# Patient Record
Sex: Female | Born: 1985 | Hispanic: No | Marital: Single | Smoking: Never smoker
Health system: Southern US, Community
[De-identification: ages and names within clinical notes are randomized; demographics above are authoritative.]

## PROBLEM LIST (undated history)

## (undated) DIAGNOSIS — G8929 Other chronic pain: Secondary | ICD-10-CM

## (undated) DIAGNOSIS — F419 Anxiety disorder, unspecified: Secondary | ICD-10-CM

## (undated) DIAGNOSIS — F32A Depression, unspecified: Secondary | ICD-10-CM

## (undated) DIAGNOSIS — F329 Major depressive disorder, single episode, unspecified: Secondary | ICD-10-CM

## (undated) DIAGNOSIS — R51 Headache: Secondary | ICD-10-CM

## (undated) DIAGNOSIS — J45909 Unspecified asthma, uncomplicated: Secondary | ICD-10-CM

## (undated) HISTORY — DX: Major depressive disorder, single episode, unspecified: F32.9

## (undated) HISTORY — DX: Other chronic pain: G89.29

## (undated) HISTORY — DX: Depression, unspecified: F32.A

## (undated) HISTORY — DX: Unspecified asthma, uncomplicated: J45.909

## (undated) HISTORY — DX: Headache: R51

## (undated) HISTORY — DX: Anxiety disorder, unspecified: F41.9

---

## 2006-05-18 ENCOUNTER — Other Ambulatory Visit: Admission: RE | Admit: 2006-05-18 | Discharge: 2006-05-18 | Payer: Self-pay | Admitting: Family Medicine

## 2012-06-01 ENCOUNTER — Encounter: Payer: Self-pay | Admitting: Internal Medicine

## 2012-06-28 ENCOUNTER — Ambulatory Visit: Payer: Self-pay | Admitting: Internal Medicine

## 2012-07-25 ENCOUNTER — Encounter: Payer: Self-pay | Admitting: Internal Medicine

## 2012-08-15 ENCOUNTER — Ambulatory Visit: Payer: Self-pay | Admitting: Internal Medicine

## 2012-08-21 ENCOUNTER — Other Ambulatory Visit (INDEPENDENT_AMBULATORY_CARE_PROVIDER_SITE_OTHER): Payer: 59

## 2012-08-21 ENCOUNTER — Encounter: Payer: Self-pay | Admitting: Internal Medicine

## 2012-08-21 ENCOUNTER — Ambulatory Visit (INDEPENDENT_AMBULATORY_CARE_PROVIDER_SITE_OTHER): Payer: 59 | Admitting: Internal Medicine

## 2012-08-21 VITALS — BP 120/70 | HR 60 | Ht 66.5 in | Wt 256.6 lb

## 2012-08-21 DIAGNOSIS — K625 Hemorrhage of anus and rectum: Secondary | ICD-10-CM

## 2012-08-21 DIAGNOSIS — R1032 Left lower quadrant pain: Secondary | ICD-10-CM

## 2012-08-21 DIAGNOSIS — K5900 Constipation, unspecified: Secondary | ICD-10-CM

## 2012-08-21 LAB — CBC WITH DIFFERENTIAL/PLATELET
Basophils Relative: 1.2 % (ref 0.0–3.0)
Eosinophils Absolute: 0.1 10*3/uL (ref 0.0–0.7)
Eosinophils Relative: 1.7 % (ref 0.0–5.0)
HCT: 37.8 % (ref 36.0–46.0)
Hemoglobin: 12.6 g/dL (ref 12.0–15.0)
Lymphs Abs: 1.7 10*3/uL (ref 0.7–4.0)
MCHC: 33.2 g/dL (ref 30.0–36.0)
MCV: 82.3 fl (ref 78.0–100.0)
Monocytes Absolute: 0.4 10*3/uL (ref 0.1–1.0)
Neutro Abs: 3.6 10*3/uL (ref 1.4–7.7)
Neutrophils Relative %: 61 % (ref 43.0–77.0)
RBC: 4.6 Mil/uL (ref 3.87–5.11)
WBC: 5.9 10*3/uL (ref 4.5–10.5)

## 2012-08-21 LAB — COMPREHENSIVE METABOLIC PANEL
Alkaline Phosphatase: 51 U/L (ref 39–117)
BUN: 8 mg/dL (ref 6–23)
CO2: 23 mEq/L (ref 19–32)
GFR: 95.64 mL/min (ref >60.00)
Glucose, Bld: 104 mg/dL — ABNORMAL HIGH (ref 70–99)
Sodium: 137 mEq/L (ref 135–145)
Total Bilirubin: 0.7 mg/dL (ref 0.3–1.2)
Total Protein: 7.7 g/dL (ref 6.0–8.3)

## 2012-08-21 MED ORDER — LINACLOTIDE 290 MCG PO CAPS: 1.0000 | ORAL_CAPSULE | Freq: Every day | ORAL | Status: DC

## 2012-08-21 NOTE — Progress Notes (Signed)
  Subjective:    Patient ID: Stefanie Castro, female    DOB: [birth year], 27 y.o.   MRN: [id number]  HPI This very nice young woman presents with complaints of chronic constipation, LLQ pain and rectal bleeding. She reports years of constipation with not more than 3 bowel movements a week. Had seen MD at age 15 and told that's the way she is. Over past 2 years has had LLQ discomfort and more variable stool sizes, including "powdery" and very small. Has also had at least a few episodes of bright red blood in the toilet bowel with/after defecation. Seems worse after she finishes menses. Was seen at [facility] in Dec and KUB was full of stool "I was told I was backed up". She was treated with whole bottle of MiraLax but did not defecate for 2 weeks. Als treated with HC suppositories. Bleeding better.  Diet is "mainly fatty foods, not much vegetables, eats wheat bread but not much fiber'Birth weight not on file going up some. + bloating, flatus and belching issues.  No Known Allergies Hydrocortisone suppository 25 mg prn BCP    Past Medical History  Diagnosis Date  . Anxiety and depression   . Chronic headaches   Childhood asthma History reviewed. No pertinent past surgical history. History   Social History  . Marital Status: Single, no kids                 Occupational History  . Banker [employer]   Social History Main Topics  . Smoking status: Never Smoker   . Smokeless tobacco: Never Used  . Alcohol Use: Yes  . Drug Use: No  .       Family History  Problem Relation Age of Onset  . Diabetes Maternal Grandfather   . Diabetes Maternal Grandmother   . Heart disease Maternal Grandfather     Review of Systems + back pain, allergies, nasal discharge x 1 week, cough x 1 week, menstrual cramps All other ROS negative or as per HPI    Objective:   Physical Exam General:  Well-developed, well-nourished and in no acute distress - obese Eyes:  anicteric. ENT:   Mouth  and posterior pharynx free of lesions.  Neck:   supple w/o thyromegaly or mass.  Lungs: Clear to auscultation bilaterally. Heart:  S1S2, no rubs, murmurs, gallops. Abdomen:  soft, non-tender, no hepatosplenomegaly, hernia, or mass and BS+.  Rectal: Female staff present - small anal tag, + anal wink, normal resting tone, no stool, no rectocele, normal squeeze, she contracted anal sphincter with simulated defecation , decreased descent,  Lymph:  no cervical or supraclavicular adenopathy. Extremities:   no edema Skin   no rash. Neuro:  A&O x 3.  Psych:  appropriate mood and  Affect.      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 I have recommended at least a sigmoidoscopy to better understand this - she wants to think about it. She understands there is a very rare but possible chance of colon or rectal cancer going undiscovered. Info provided and the risks and benefits as well as alternatives of endoscopic procedure(s) have been discussed and reviewed. All questions answered. The patient agrees to proceed. 2. Linzess 290 ug daily trial 3. High fiber diet 4. CBC, CMET, TSH 5. REV 1 month

## 2012-08-21 NOTE — Patient Instructions (Addendum)
Your physician has requested that you go to the basement for the following lab work before leaving today: CBC/diff, TSH, CMET  Today you have been given a High Fiber Diet handout to read and follow.  Also we are giving you colonoscopy and sigmoidscopy information to read over and think about.  Today we are giving you samples of Linzess to take one capsule 30 minutes prior to breakfast daily to help with constipation.  We are giving you a written rx to use if the samples work.  Follow up with [place] in a month.  Thank you for choosing me and [facility].  Iva Boop, M.D., [facility]

## 2012-08-24 NOTE — Progress Notes (Signed)
Quick Note:  Labs are all ok  See you at follow-up ______

## 2012-08-25 ENCOUNTER — Ambulatory Visit: Payer: Self-pay | Admitting: Internal Medicine

## 2016-08-26 ENCOUNTER — Telehealth: Payer: Self-pay | Admitting: General Practice

## 2016-11-23 NOTE — Telephone Encounter (Signed)
-----   Message from Sheliah HatchKatherine E Tabori, MD sent at 08/26/2016 10:38 AM EST ----- Regarding: RE: New Patient Contact: [phone] Ok to establish  Thanks! KT ----- Message ----- From: Drusilla KannerSuandrea L Miles Sent: 08/26/2016  10:29 AM To: Sheliah HatchKatherine E Tabori, MD Subject: New Patient                                    Patient calling to establish care as a new patient. Dr. Beverely Lowabori was recommended to her by her sister Saundra ShellingJanice Henderson, who is an existing patient.  Thanks, Tim LairSuandrea

## 2017-04-18 ENCOUNTER — Emergency Department (HOSPITAL_BASED_OUTPATIENT_CLINIC_OR_DEPARTMENT_OTHER)
Admission: EM | Admit: 2017-04-18 | Discharge: 2017-04-18 | Disposition: A | Discharge status: Home / Self Care | Payer: BLUE CROSS/BLUE SHIELD | Attending: Emergency Medicine | Admitting: Emergency Medicine

## 2017-04-18 ENCOUNTER — Emergency Department (HOSPITAL_BASED_OUTPATIENT_CLINIC_OR_DEPARTMENT_OTHER): Payer: BLUE CROSS/BLUE SHIELD

## 2017-04-18 ENCOUNTER — Encounter (HOSPITAL_BASED_OUTPATIENT_CLINIC_OR_DEPARTMENT_OTHER): Payer: Self-pay

## 2017-04-18 DIAGNOSIS — Z791 Long term (current) use of non-steroidal anti-inflammatories (NSAID): Secondary | ICD-10-CM | POA: Insufficient documentation

## 2017-04-18 DIAGNOSIS — R072 Precordial pain: Secondary | ICD-10-CM | POA: Insufficient documentation

## 2017-04-18 DIAGNOSIS — Z7984 Long term (current) use of oral hypoglycemic drugs: Secondary | ICD-10-CM | POA: Insufficient documentation

## 2017-04-18 DIAGNOSIS — R519 Headache, unspecified: Secondary | ICD-10-CM

## 2017-04-18 DIAGNOSIS — R51 Headache: Secondary | ICD-10-CM | POA: Diagnosis not present

## 2017-04-18 LAB — CBC WITH DIFFERENTIAL/PLATELET
BASOS PCT: 0 %
Basophils Absolute: 0 10*3/uL (ref 0.0–0.1)
EOS ABS: 0.1 10*3/uL (ref 0.0–0.7)
Eosinophils Relative: 1 %
HCT: 34.3 % — ABNORMAL LOW (ref 36.0–46.0)
Hemoglobin: 11.1 g/dL — ABNORMAL LOW (ref 12.0–15.0)
LYMPHS ABS: 1.9 10*3/uL (ref 0.7–4.0)
Lymphocytes Relative: 33 %
MCH: 23.5 pg — AB (ref 26.0–34.0)
MCHC: 32.4 g/dL (ref 30.0–36.0)
MCV: 72.7 fL — ABNORMAL LOW (ref 78.0–100.0)
MONO ABS: 0.6 10*3/uL (ref 0.1–1.0)
Monocytes Relative: 10 %
Neutro Abs: 3.3 10*3/uL (ref 1.7–7.7)
Neutrophils Relative %: 56 %
PLATELETS: 424 10*3/uL — AB (ref 150–400)
RBC: 4.72 MIL/uL (ref 3.87–5.11)
RDW: 14.9 % (ref 11.5–15.5)
WBC: 5.9 10*3/uL (ref 4.0–10.5)

## 2017-04-18 LAB — PREGNANCY, URINE: PREG TEST UR: NEGATIVE

## 2017-04-18 LAB — BASIC METABOLIC PANEL
ANION GAP: 8 (ref 5–15)
BUN: 9 mg/dL (ref 6–20)
CALCIUM: 9 mg/dL (ref 8.9–10.3)
CHLORIDE: 105 mmol/L (ref 101–111)
CO2: 24 mmol/L (ref 22–32)
Creatinine, Ser: 0.72 mg/dL (ref 0.44–1.00)
GFR calc Af Amer: >60 mL/min (ref >60)
GFR calc non Af Amer: >60 mL/min (ref >60)
GLUCOSE: 108 mg/dL — AB (ref 65–99)
Potassium: 3.9 mmol/L (ref 3.5–5.1)
Sodium: 137 mmol/L (ref 135–145)

## 2017-04-18 LAB — D-DIMER, QUANTITATIVE: D-Dimer, Quant: 0.89 ug/mL-FEU — ABNORMAL HIGH (ref 0.00–0.50)

## 2017-04-18 LAB — TROPONIN I: Troponin I: <0.03 ng/mL (ref <0.03)

## 2017-04-18 MED ORDER — DEXAMETHASONE SODIUM PHOSPHATE 10 MG/ML IJ SOLN
10.0000 mg | Freq: Once | INTRAMUSCULAR | Status: AC
  Administered 2017-04-18: 10 mg via INTRAVENOUS
  Filled 2017-04-18: qty 1

## 2017-04-18 MED ORDER — IOPAMIDOL (ISOVUE-370) INJECTION 76%
100.0000 mL | Freq: Once | INTRAVENOUS | Status: AC | PRN
  Administered 2017-04-18: 100 mL via INTRAVENOUS

## 2017-04-18 MED ORDER — SODIUM CHLORIDE 0.9 % IV BOLUS (SEPSIS)
1000.0000 mL | Freq: Once | INTRAVENOUS | Status: AC
  Administered 2017-04-18: 1000 mL via INTRAVENOUS

## 2017-04-18 MED ORDER — SODIUM CHLORIDE 0.9 % IV SOLN
INTRAVENOUS | Status: DC
  Administered 2017-04-18: 16:00:00 via INTRAVENOUS

## 2017-04-18 MED ORDER — METOCLOPRAMIDE HCL 5 MG/ML IJ SOLN
10.0000 mg | Freq: Once | INTRAMUSCULAR | Status: AC
  Administered 2017-04-18: 10 mg via INTRAVENOUS
  Filled 2017-04-18: qty 2

## 2017-04-18 MED ORDER — PROMETHAZINE HCL 25 MG PO TABS: 25.0000 mg | ORAL_TABLET | Freq: Four times a day (QID) | ORAL | 1 refills | Status: AC | PRN

## 2017-04-18 MED ORDER — DIPHENHYDRAMINE HCL 50 MG/ML IJ SOLN
25.0000 mg | Freq: Once | INTRAMUSCULAR | Status: AC
  Administered 2017-04-18: 25 mg via INTRAVENOUS
  Filled 2017-04-18: qty 1

## 2017-04-18 NOTE — Discharge Instructions (Signed)
Recommend follow-up with your regular doctor for thyroid check.  Also if headache persist MRI of brain would be appropriate.  Today's head CT and CT angios chest and workup for the chest pain without any acute findings.  Did give a migraine cocktail here hopefully it will kick in and help relieve the headache but if it does not improve follow-up with your doctor would be important.  Also we noted that your heart rate tends to be in the low 100s.  This is why there is a recommendation for thyroid function test.

## 2017-04-18 NOTE — ED Triage Notes (Signed)
C/o HA 1-2 weeks-was seen at [facility] 10 days ago dx with sinus infection-rx abx and naproxen-states HA no better-c/o CP x 5 days-NAD-steady gait

## 2017-04-18 NOTE — ED Provider Notes (Signed)
[facility] EMERGENCY DEPARTMENT Provider Note   CSN: [id number] Arrival date & time: 04/18/17  1436     History   Chief Complaint Chief Complaint  Patient presents with  . Headache  . Chest Pain    HPI Stefanie Castro is a 31 y.o. female.  Patient presenting with 2 complaints.  One is a headache bilateral temporal area into the back of the head associated with some visual changes.  Headaches been present for 3 weeks.  Patient at one time in the past was evaluated for possible migraines but no confirmed diagnosis.  Other complaint is chest pain that started on Thursday 4 days ago.  Left upper chest.  Not associated with shortness of breath not associated with nausea and vomiting patient's heart rate in triage was noted to be about 110.  Patient is never had chest pain like this before.  Nothing makes it worse or better.  Still present.      Past Medical History:  Diagnosis Date  . Anxiety and depression   . Childhood asthma   . Chronic headaches     There are no active problems to display for this patient.   History reviewed. No pertinent surgical history.  OB History    No data available       Home Medications    Prior to Admission medications   Medication Sig Start Date End Date Taking? Authorizing Provider  METFORMIN HCL PO Take by mouth.   Yes [provider]  NAPROXEN PO Take by mouth.   Yes [provider]  promethazine (PHENERGAN) 25 MG tablet Take 1 tablet (25 mg total) by mouth every 6 (six) hours as needed for nausea or vomiting. 04/18/17   Vanetta Mulders, MD    Family History Family History  Problem Relation Age of Onset  . Diabetes Maternal Grandfather   . Heart disease Maternal Grandfather   . Diabetes Maternal Grandmother     Social History Social History  Substance Use Topics  . Smoking status: Never Smoker  . Smokeless tobacco: Never Used  . Alcohol use Yes     Comment: occ     Allergies   Patient  has no known allergies.   Review of Systems Review of Systems  Constitutional: Negative for fever.  HENT: Negative for congestion.   Eyes: Positive for visual disturbance.  Respiratory: Positive for shortness of breath.   Gastrointestinal: Negative for abdominal pain, nausea and vomiting.  Genitourinary: Negative for dysuria.  Musculoskeletal: Negative for back pain.  Skin: Negative for rash.  Neurological: Positive for headaches. Negative for speech difficulty, weakness and numbness.  Hematological: Does not bruise/bleed easily.  Psychiatric/Behavioral: Negative for confusion.     Physical Exam Updated Vital Signs BP 131/86 (BP Location: Left Arm)   Pulse 90   Temp 98.3 F (36.8 C) (Oral)   Resp (!) 22   Ht 1.676 m (5\' 6" )   Wt 110.6 kg (243 lb 13.3 oz)   LMP 04/09/2017   SpO2 100%   BMI 39.35 kg/m   Physical Exam  Constitutional: She is oriented to person, place, and time. She appears well-developed and well-nourished. No distress.  HENT:  Head: Normocephalic and atraumatic.  Mouth/Throat: Oropharynx is clear and moist.  Eyes: Pupils are equal, round, and reactive to light. Conjunctivae and EOM are normal.  Neck: Normal range of motion. Neck supple.  Cardiovascular: Normal rate, regular rhythm and normal heart sounds.   Pulmonary/Chest: Effort normal and breath sounds normal. No respiratory distress.  Abdominal: Soft. Bowel sounds are normal. There is no tenderness.  Musculoskeletal: Normal range of motion. She exhibits no edema.  Neurological: She is alert and oriented to person, place, and time. No cranial nerve deficit or sensory deficit. She exhibits normal muscle tone. Coordination normal.  Skin: Skin is warm. No rash noted.  Nursing note and vitals reviewed.    ED Treatments / Results  Labs (all labs ordered are listed, but only abnormal results are displayed) Labs Reviewed  CBC WITH DIFFERENTIAL/PLATELET - Abnormal; Notable for the following:        Result Value   Hemoglobin 11.1 (*)    HCT 34.3 (*)    MCV 72.7 (*)    MCH 23.5 (*)    Platelets 424 (*)    All other components within normal limits  BASIC METABOLIC PANEL - Abnormal; Notable for the following:    Glucose, Bld 108 (*)    All other components within normal limits  D-DIMER, QUANTITATIVE (NOT AT [facility]) - Abnormal; Notable for the following:    D-Dimer, Quant 0.89 (*)    All other components within normal limits  TROPONIN I  PREGNANCY, URINE    EKG  EKG Interpretation  Date/Time:  Monday April 18 2017 14:53:38 EDT Ventricular Rate:  112 PR Interval:    QRS Duration: 78 QT Interval:  321 QTC Calculation: 439 R Axis:   23 Text Interpretation:  Sinus tachycardia Probable anteroseptal infarct, old no previous Confirmed by Vanetta Mulders [phone]) on 04/18/2017 3:22:57 PM       Radiology Dg Chest 2 View  Result Date: 04/18/2017 CLINICAL DATA:  Two week history of occipital headache. Five day history of chest pain. Intermittent dizziness when supine. Patient was diagnosed with sinusitis 10 days ago at an urgent care. EXAM: CHEST  2 VIEW COMPARISON:  None. FINDINGS: Cardiomediastinal silhouette unremarkable. Lungs clear. Bronchovascular markings normal. Pulmonary vascularity normal. No pneumothorax. No pleural effusions. Visualized bony thorax intact. IMPRESSION: Normal examination. Electronically Signed   By: Hulan Saas M.D.   On: 04/18/2017 15:56   Ct Head Wo Contrast  Result Date: 04/18/2017 CLINICAL DATA:  31 y/o  F; 1-2 weeks occipital region headache. EXAM: CT HEAD WITHOUT CONTRAST TECHNIQUE: Contiguous axial images were obtained from the base of the skull through the vertex without intravenous contrast. COMPARISON:  None. FINDINGS: Brain: No evidence of acute infarction, hemorrhage, hydrocephalus, extra-axial collection or mass lesion/mass effect. Incidental partially empty sella turcica. Vascular: No hyperdense vessel or unexpected calcification. Skull:  Normal. Negative for fracture or focal lesion. Sinuses/Orbits: No acute finding. Other: None. IMPRESSION: No acute intracranial abnormality identified. Unremarkable CT of head. Electronically Signed   By: Mitzi Hansen M.D.   On: 04/18/2017 16:00   Ct Angio Chest Pe W/cm &/or Wo Cm  Result Date: 04/18/2017 CLINICAL DATA:  Chest pain x5 days.  History of asthma. EXAM: CT ANGIOGRAPHY CHEST WITH CONTRAST TECHNIQUE: Multidetector CT imaging of the chest was performed using the standard protocol during bolus administration of intravenous contrast. Multiplanar CT image reconstructions and MIPs were obtained to evaluate the vascular anatomy. CONTRAST:  100 cc Isovue 370 IV COMPARISON:  04/18/2017 CXR FINDINGS: Cardiovascular: The study is of quality for the evaluation of pulmonary embolism. There are no filling defects in the central, lobar, segmental or subsegmental pulmonary artery branches to suggest acute pulmonary embolism. Great vessels are normal in course and caliber. Normal heart size. No significant pericardial fluid/thickening. Mediastinum/Nodes: No discrete thyroid nodules. Unremarkable esophagus. No pathologically enlarged axillary, mediastinal or  hilar lymph nodes. No aortic aneurysm or dissection. Lungs/Pleura: No pneumothorax. No pleural effusion. No pneumonic consolidation. Upper abdomen: Unremarkable. Musculoskeletal:  No aggressive appearing focal osseous lesions. Review of the MIP images confirms the above findings. IMPRESSION: No acute pulmonary embolus or active pulmonary disease. Electronically Signed   By: Tollie Ethavid  Kwon M.D.   On: 04/18/2017 18:09    Procedures Procedures (including critical care time)  Medications Ordered in ED Medications  0.9 %  sodium chloride infusion ( Intravenous New Bag/Given 04/18/17 1613)  sodium chloride 0.9 % bolus 1,000 mL (0 mLs Intravenous Stopped 04/18/17 1922)  metoCLOPramide (REGLAN) injection 10 mg (10 mg Intravenous Given 04/18/17 1612)    dexamethasone (DECADRON) injection 10 mg (10 mg Intravenous Given 04/18/17 1612)  diphenhydrAMINE (BENADRYL) injection 25 mg (25 mg Intravenous Given 04/18/17 1612)  iopamidol (ISOVUE-370) 76 % injection 100 mL (100 mLs Intravenous Contrast Given 04/18/17 1745)     Initial Impression / Assessment and Plan / ED Course  I have reviewed the triage vital signs and the nursing notes.  Pertinent labs & imaging results that were available during my care of the patient were reviewed by me and considered in my medical decision making (see chart for details).    Patient's workup for the headache was a migraine cocktail that included Reglan and Benadryl and Decadron.  As well as IV fluids.  Also head CT was done which was negative.  Patient without any real significant improvement in headache.  But never appear to be any real distress.  Workup for the chest pain included troponin which was negative chest x-ray without acute findings.  D-dimer was done because of the persistent tachycardia.  D-dimer was slightly elevated.  So patient had CT angios which was negative for pulmonary embolus or any other acute findings.  Labs without significant abnormalities.  Patient will be treated symptomatically.  Phenergan provided take for the headache.  Patient does have a primary care doctor to follow-up with.  If headaches persist will need MRI.   Final Clinical Impressions(s) / ED Diagnoses   Final diagnoses:  Acute intractable headache, unspecified headache type  Precordial pain    New Prescriptions New Prescriptions   PROMETHAZINE (PHENERGAN) 25 MG TABLET    Take 1 tablet (25 mg total) by mouth every 6 (six) hours as needed for nausea or vomiting.     Vanetta MuldersZackowski, Naelle Diegel, MD 04/19/17 [phone]

## 2018-03-25 IMAGING — CT CT ANGIO CHEST
2 of 8 series · 19 of 36 positions shown · IV contrast (isovue)
Comparison: 04/18/2017 CXR

CLINICAL DATA: Chest pain x5 days.  History of asthma.

EXAM:
CT ANGIOGRAPHY CHEST WITH CONTRAST
TECHNIQUE: Multidetector CT imaging of the chest was performed using the
standard protocol during bolus administration of intravenous
contrast. Multiplanar CT image reconstructions and MIPs were
obtained to evaluate the vascular anatomy.
CONTRAST:  100 cc Isovue 370 IV

[Series 6: pe thins · axial · 0.82mm/px · z∈[+1078,+1310]mm · 18 of 260 slices shown]
[im 14/260  lung]
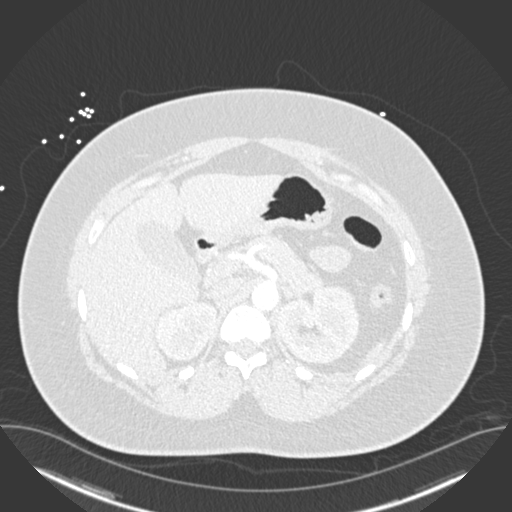
[im 28/260  mediastinal]
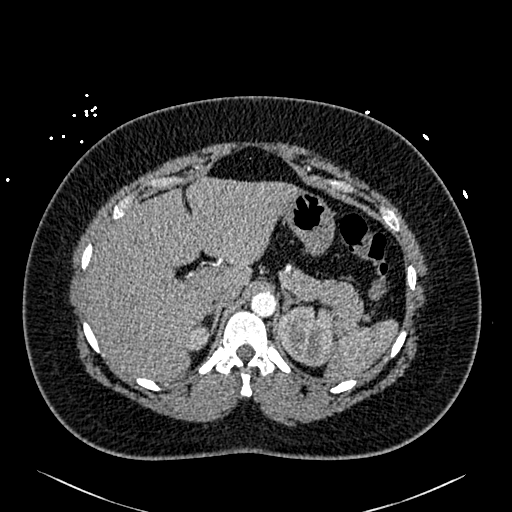
[im 41/260  lung]
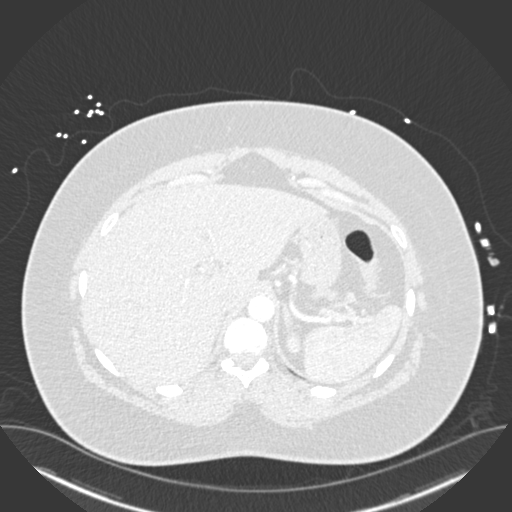
[im 55/260  mediastinal]
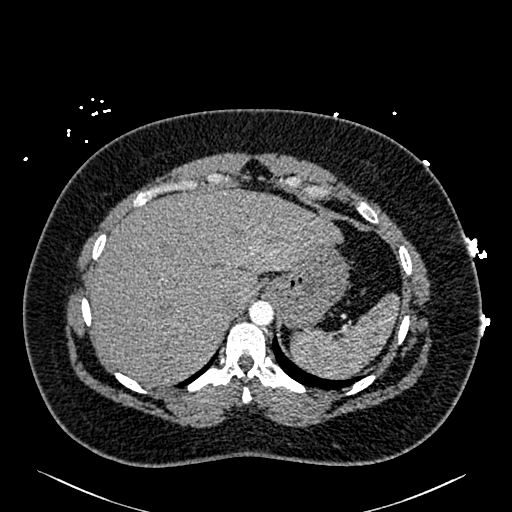
[im 69/260  lung]
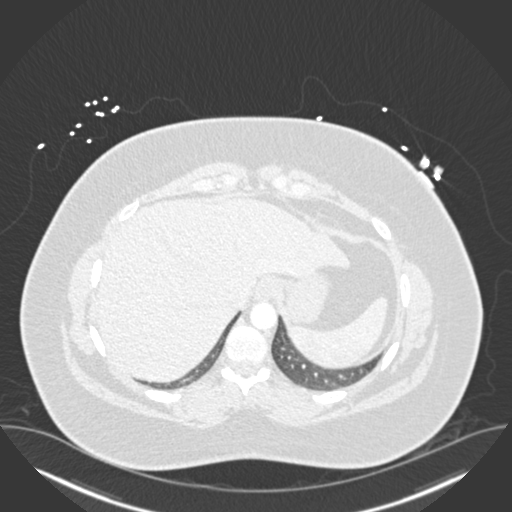
[im 82/260  mediastinal]
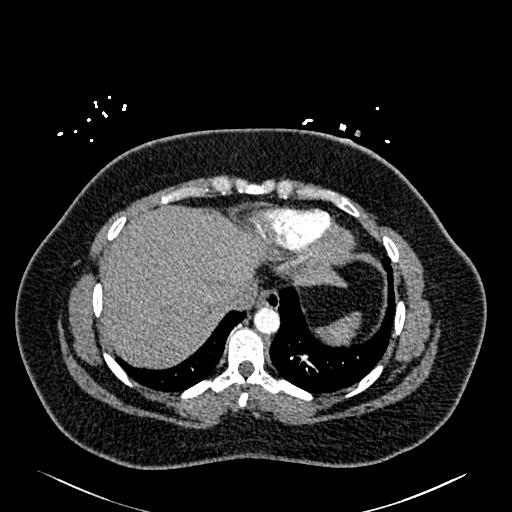
[im 96/260  lung]
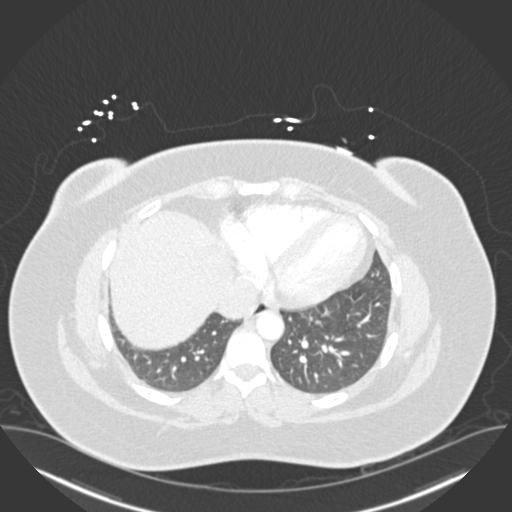
[im 110/260  mediastinal]
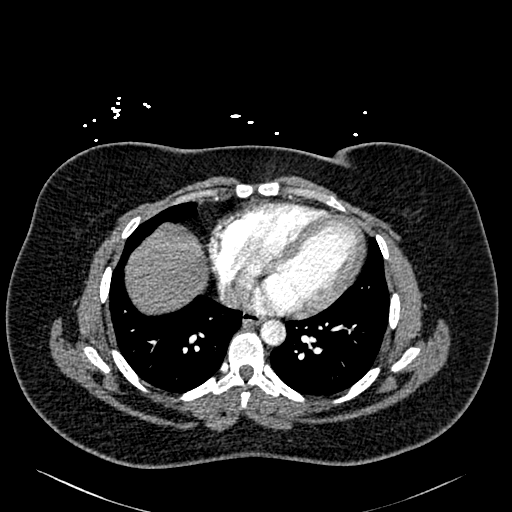
[im 123/260  lung]
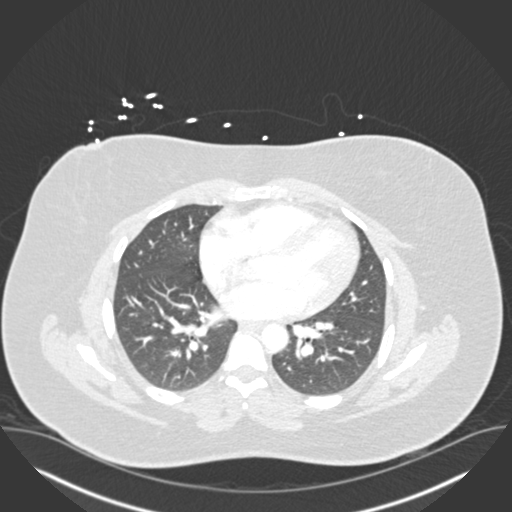
[im 137/260  mediastinal]
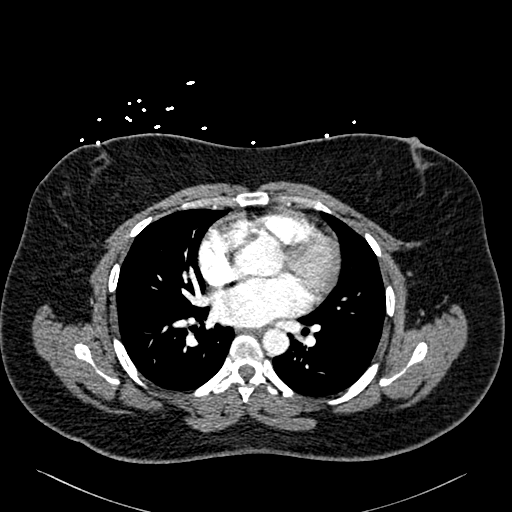
[im 150/260  lung]
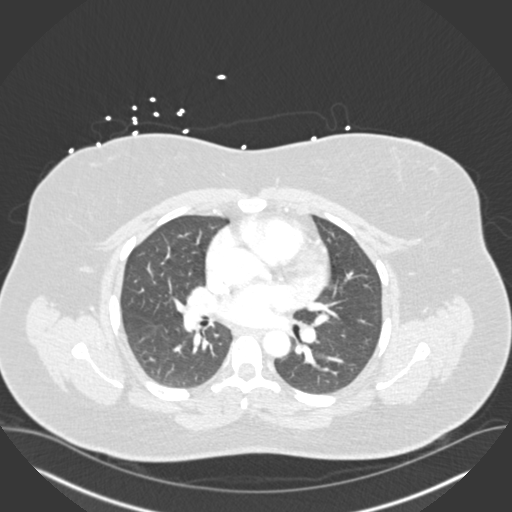
[im 164/260  mediastinal]
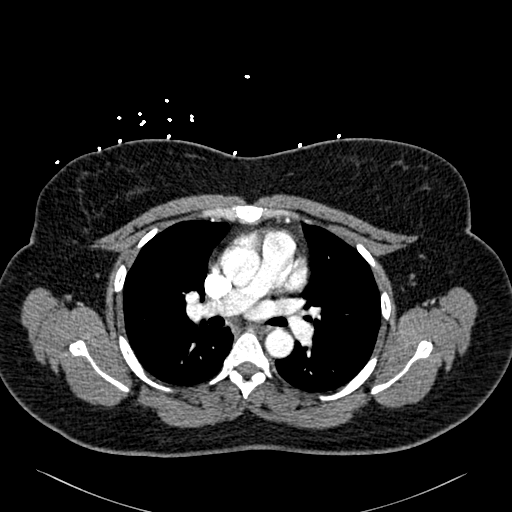
[im 178/260  lung]
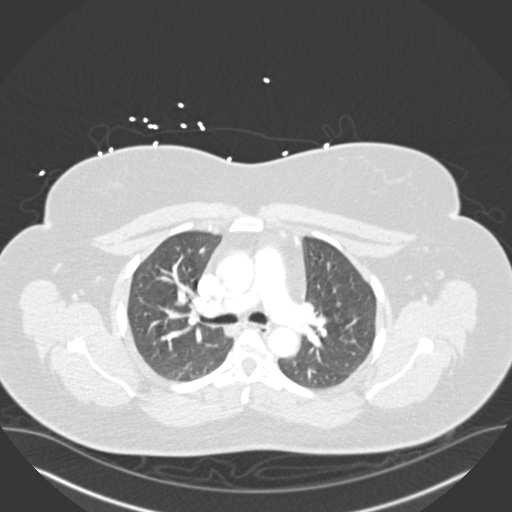
[im 191/260  mediastinal]
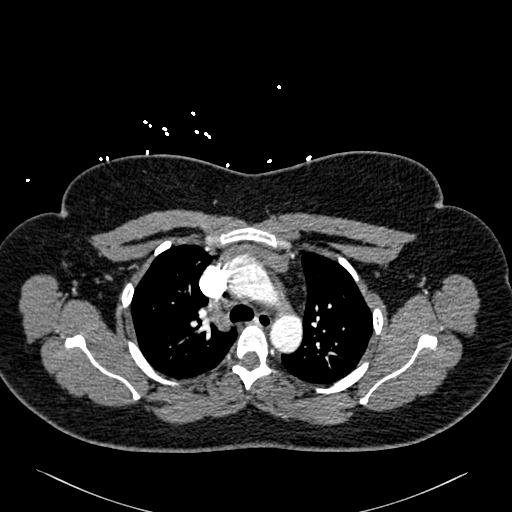
[im 205/260  lung]
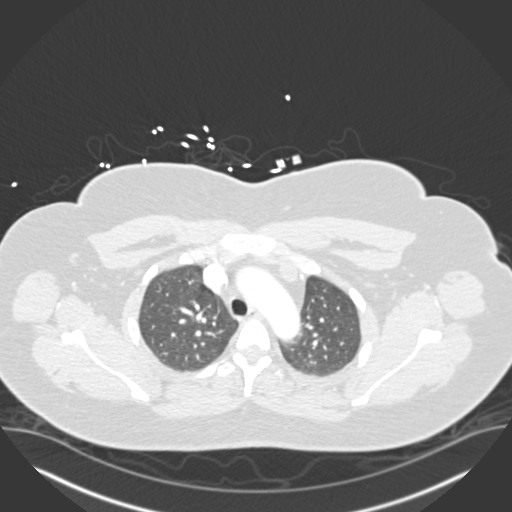
[im 219/260  mediastinal]
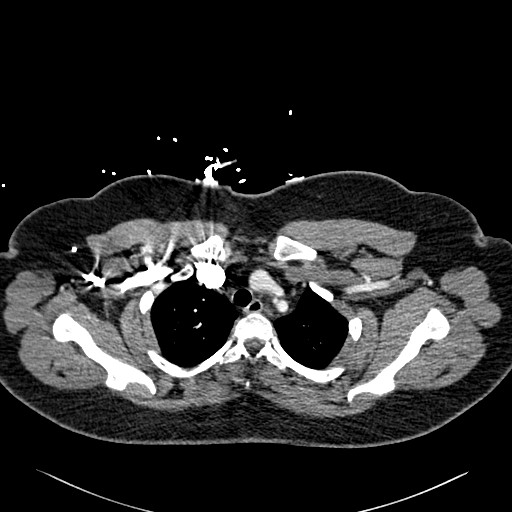
[im 232/260  lung]
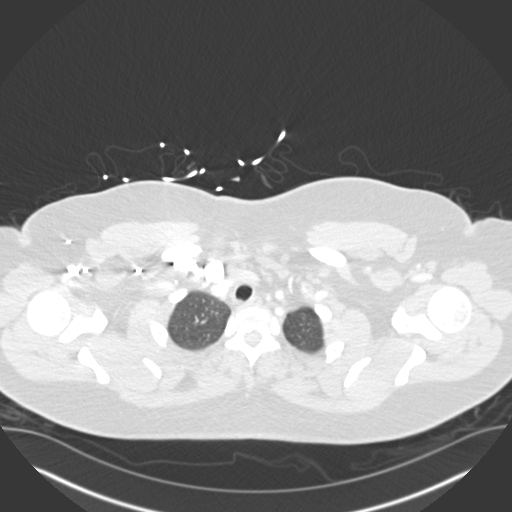
[im 246/260  mediastinal]
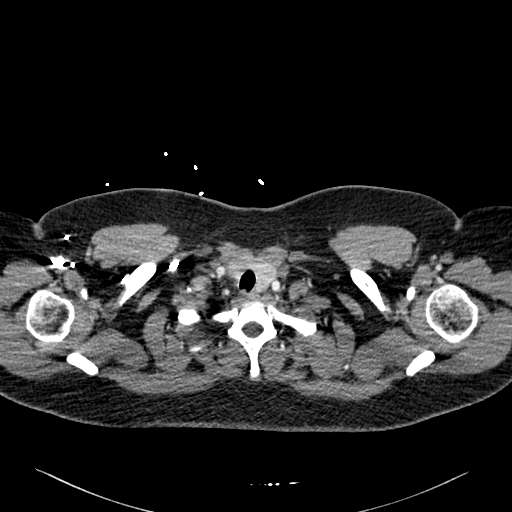

[Series 7: pe coronal mpr · coronal · 0.53mm/px · 1 of 126 slices shown]
[im 63/126  mediastinal]
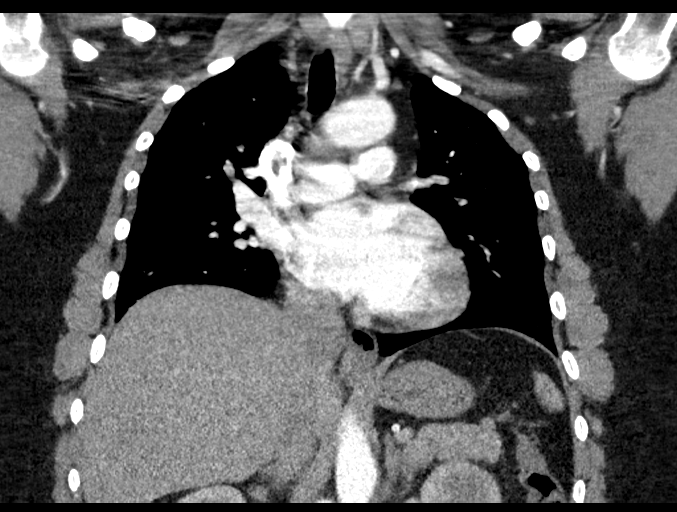

[19 of 36 positions shown; findings below may reference images not displayed]

FINDINGS: Cardiovascular: The study is of quality for the evaluation of
pulmonary embolism. There are no filling defects in the central,
lobar, segmental or subsegmental pulmonary artery branches to
suggest acute pulmonary embolism. Great vessels are normal in course
and caliber. Normal heart size. No significant pericardial
fluid/thickening.

Mediastinum/Nodes: No discrete thyroid nodules. Unremarkable
esophagus. No pathologically enlarged axillary, mediastinal or hilar
lymph nodes. No aortic aneurysm or dissection.

Lungs/Pleura: No pneumothorax. No pleural effusion. No pneumonic
consolidation.

Upper abdomen: Unremarkable.

Musculoskeletal:  No aggressive appearing focal osseous lesions.

Review of the MIP images confirms the above findings.
IMPRESSION: No acute pulmonary embolus or active pulmonary disease.

## 2018-03-25 IMAGING — CR DG CHEST 2V
2 series · 2 of 2 positions shown · non-contrast
Comparison: None.

CLINICAL DATA: Two week history of occipital headache. Five day
history of chest pain. Intermittent dizziness when supine. Patient
was diagnosed with sinusitis 10 days ago at an [HOSPITAL].

EXAM:
CHEST  2 VIEW

[w chest pa]
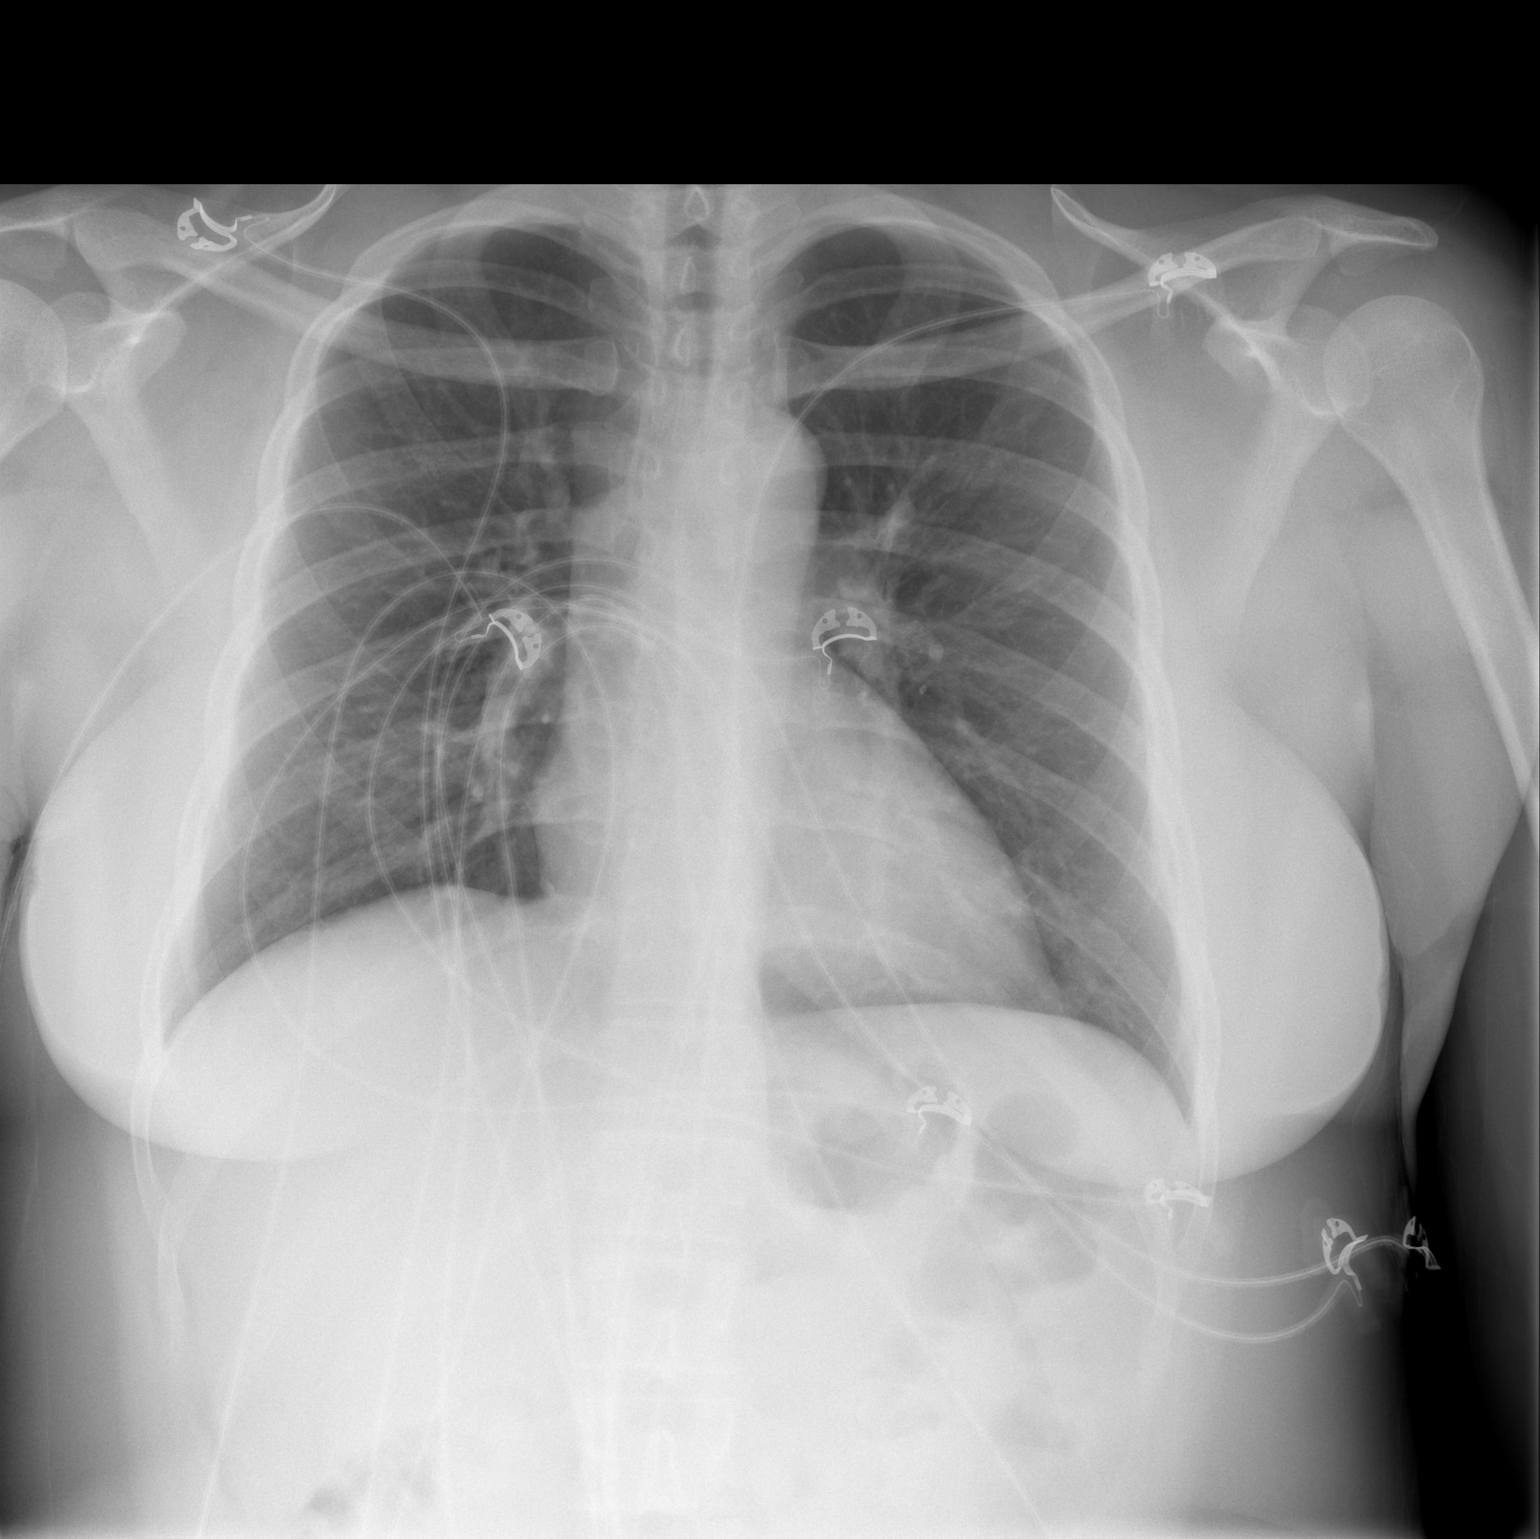

[w chest lat]
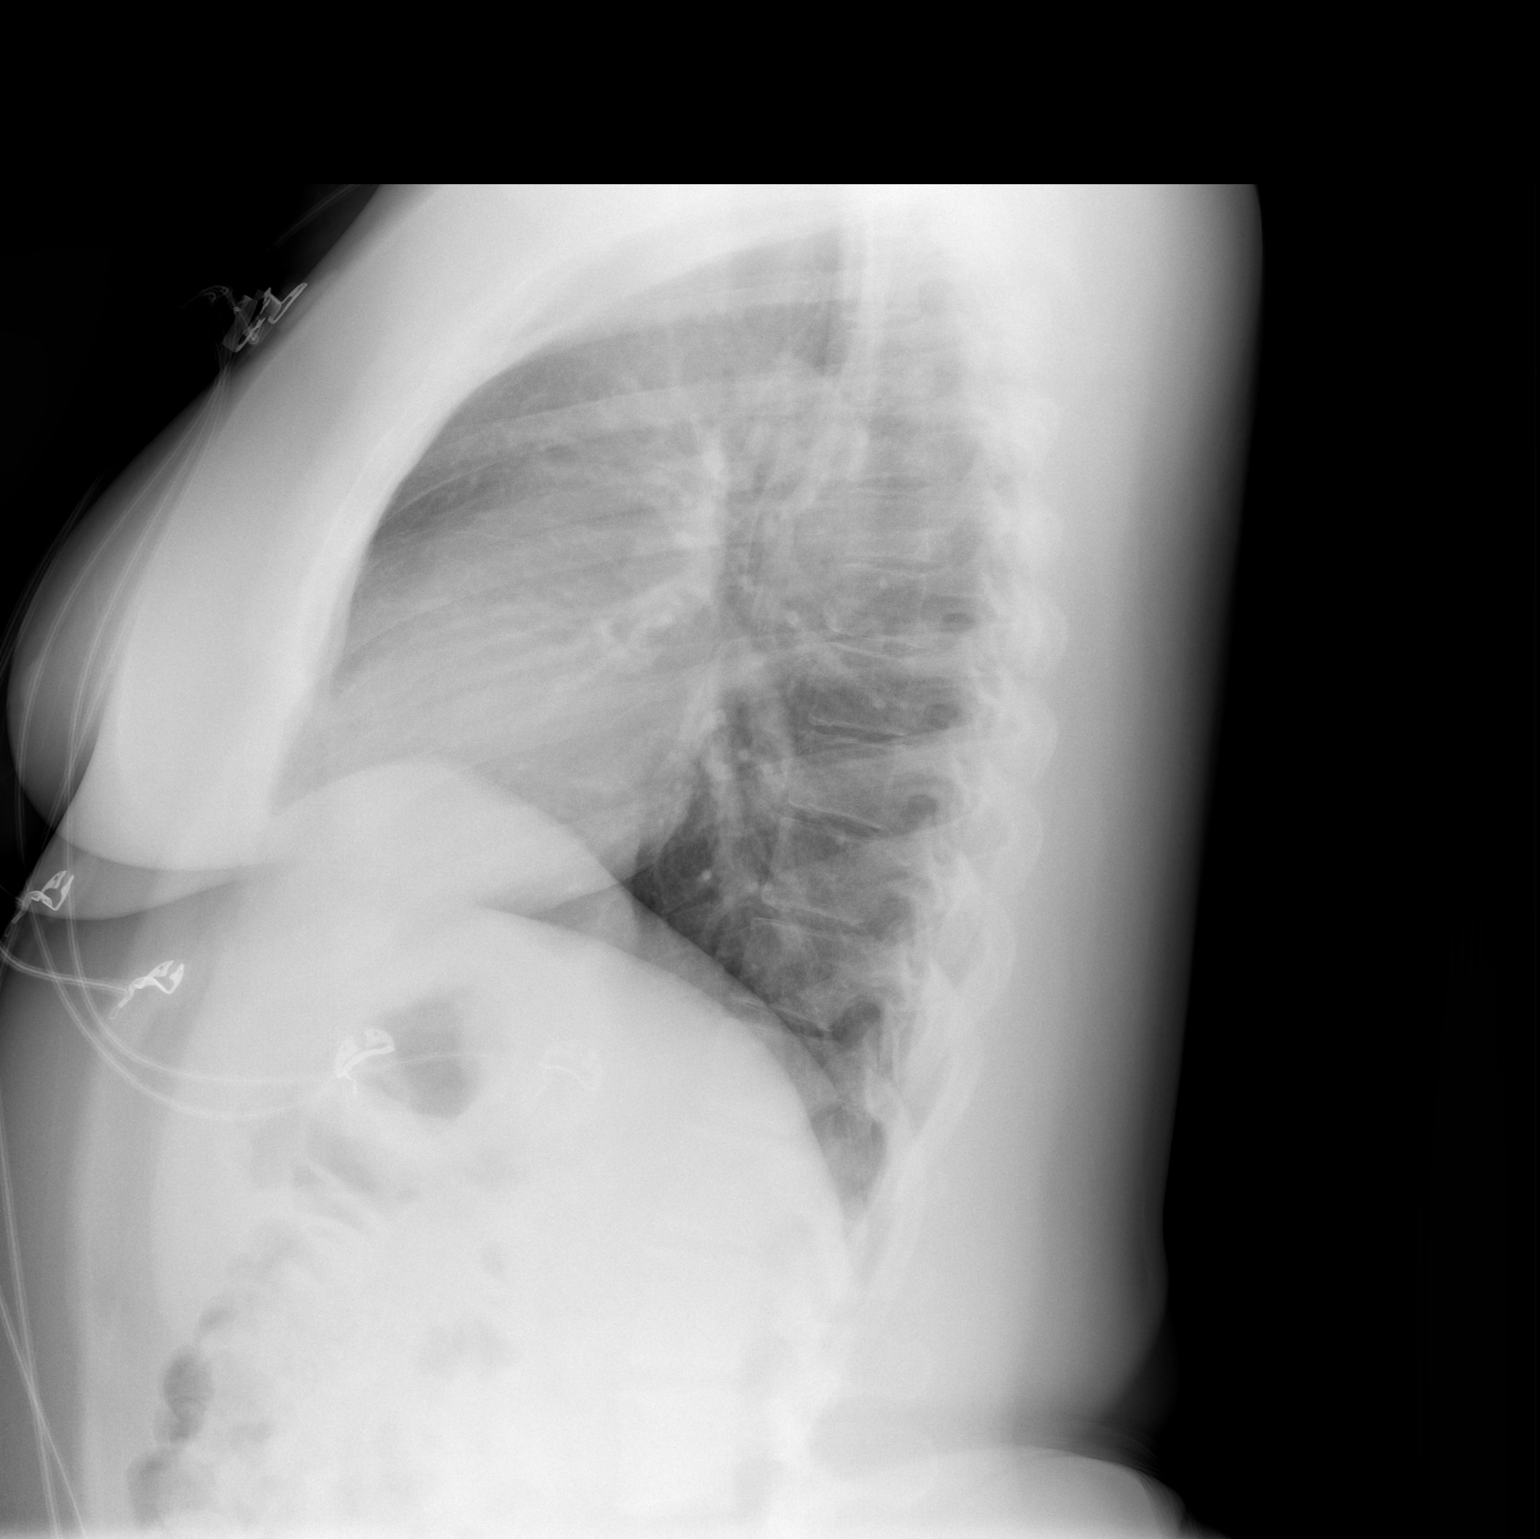

[2 of 2 positions shown; findings below may reference images not displayed]

FINDINGS: Cardiomediastinal silhouette unremarkable. Lungs clear.
Bronchovascular markings normal. Pulmonary vascularity normal. No
pneumothorax. No pleural effusions. Visualized bony thorax intact.
IMPRESSION: Normal examination.

## 2018-03-25 IMAGING — CT CT HEAD W/O CM
3 series · 15 of 47 positions shown, 18 images · non-contrast
Comparison: None.

CLINICAL DATA: 31 y/o  F; 1-2 weeks occipital region headache.

EXAM:
CT HEAD WITHOUT CONTRAST
TECHNIQUE: Contiguous axial images were obtained from the base of the skull
through the vertex without intravenous contrast.

[Series 2: head wo · axial · 0.43mm/px · z∈[-170,-45]mm · 9 of 30 slices shown, 12 images]
[im 3/30  brain]
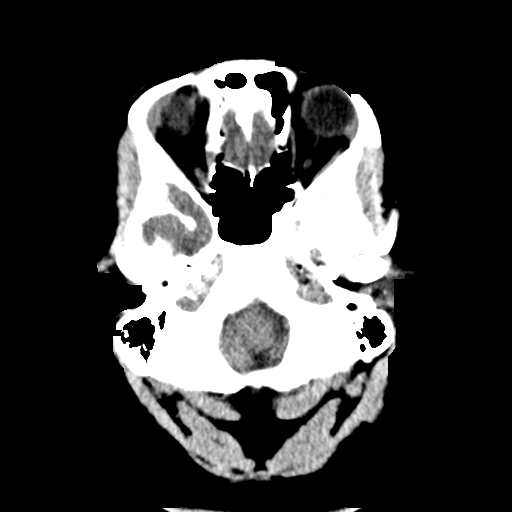
[im 3/30  bone]
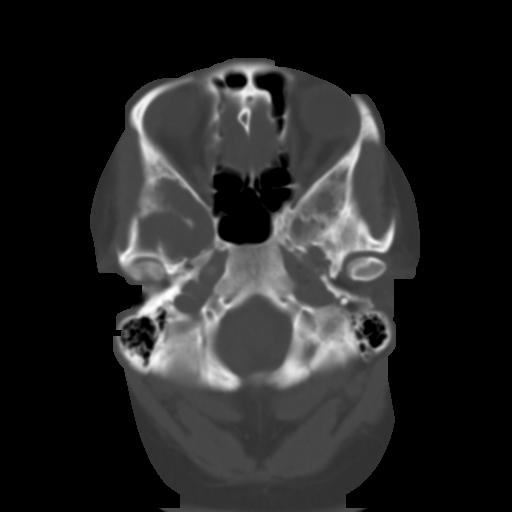
[im 6/30  brain]
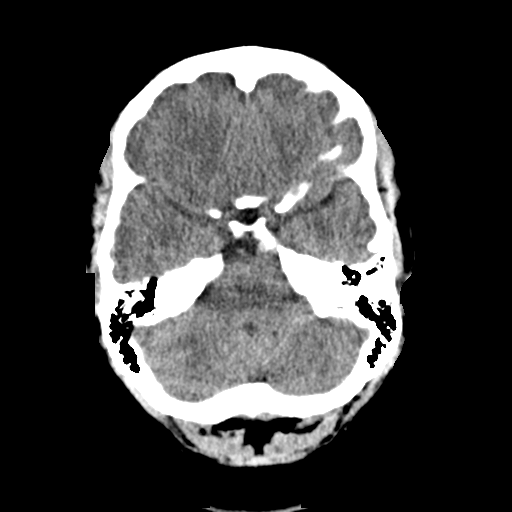
[im 9/30  brain]
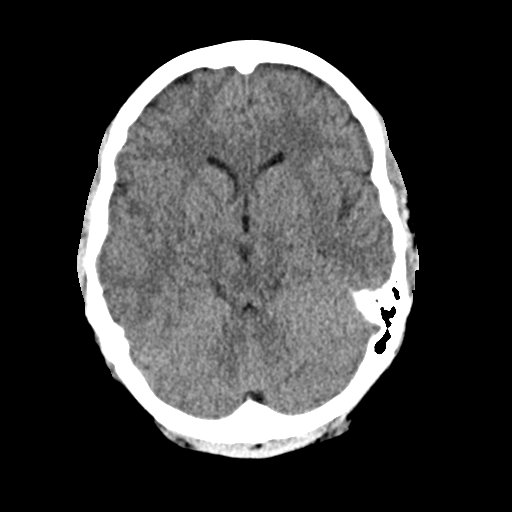
[im 12/30  brain]
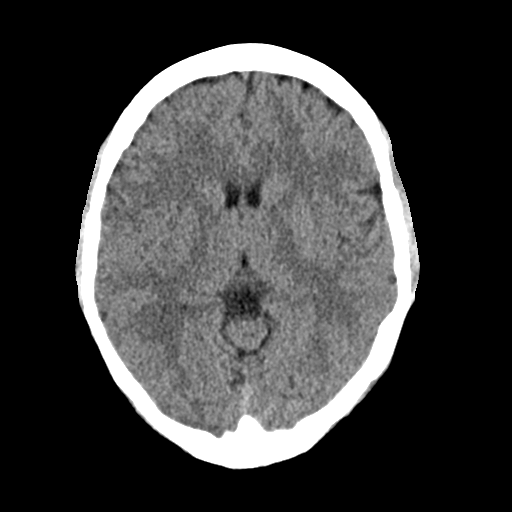
[im 16/30  brain]
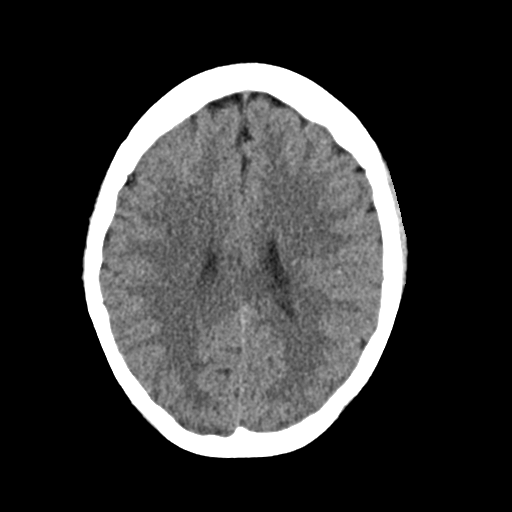
[im 16/30  bone]
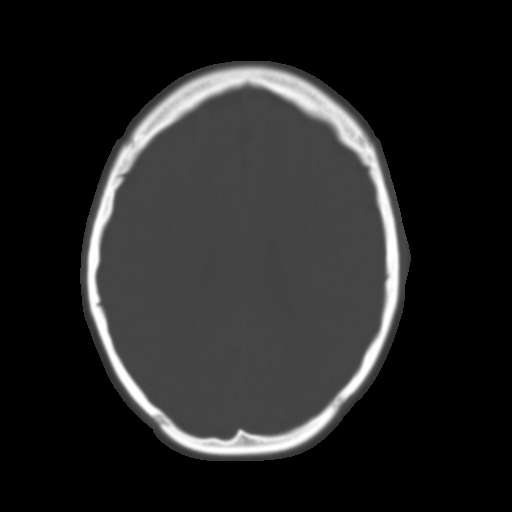
[im 19/30  brain]
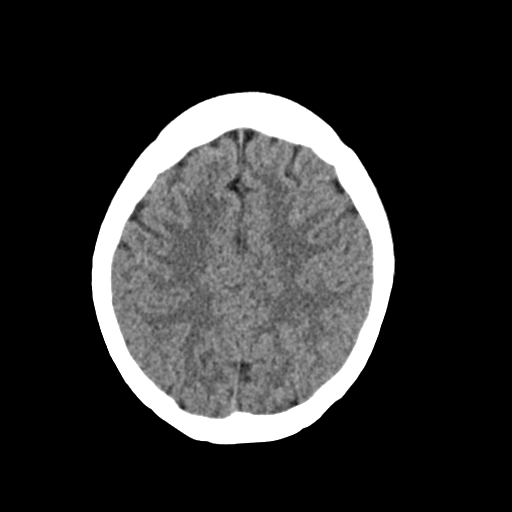
[im 22/30  brain]
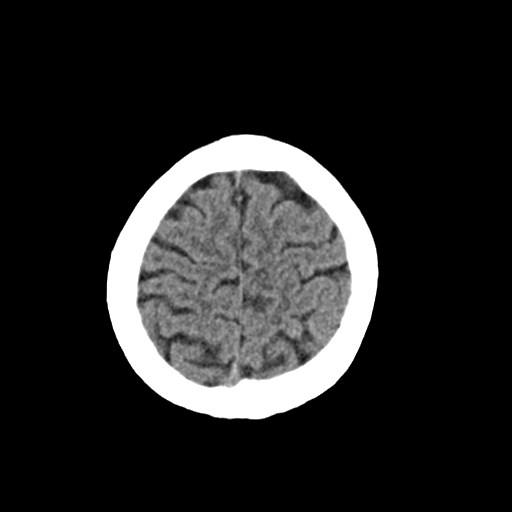
[im 25/30  brain]
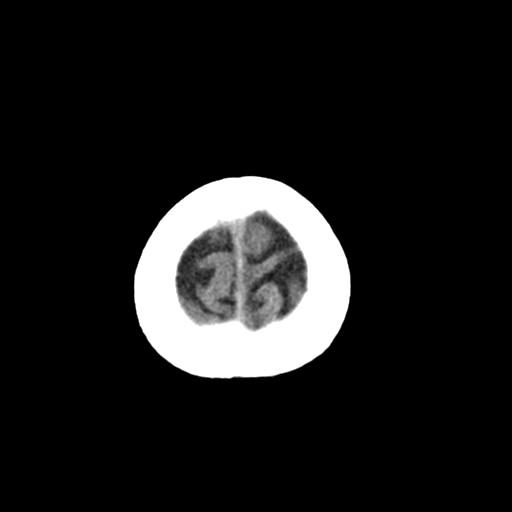
[im 28/30  brain]
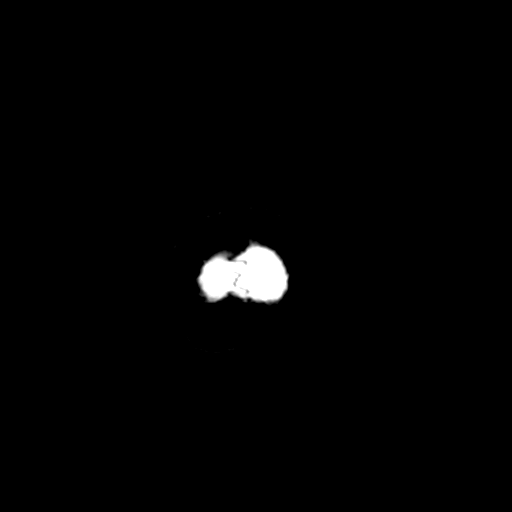
[im 28/30  bone]
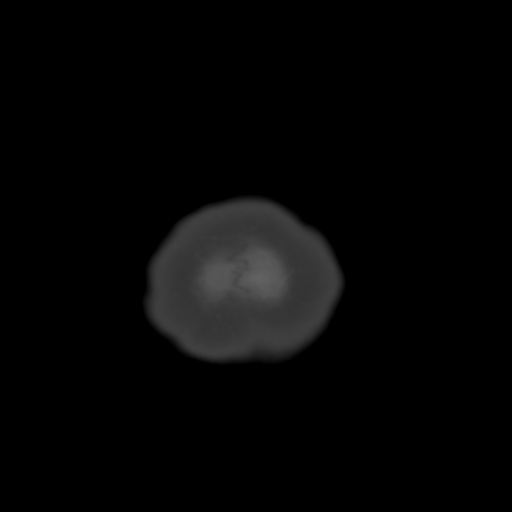

[Series 4: coronal soft · coronal · 0.29mm/px · 3 of 70 slices shown]
[im 24/70  brain]
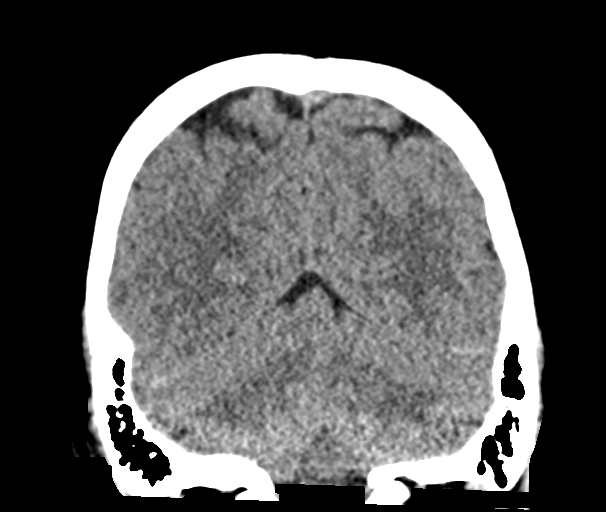
[im 31/70  brain]
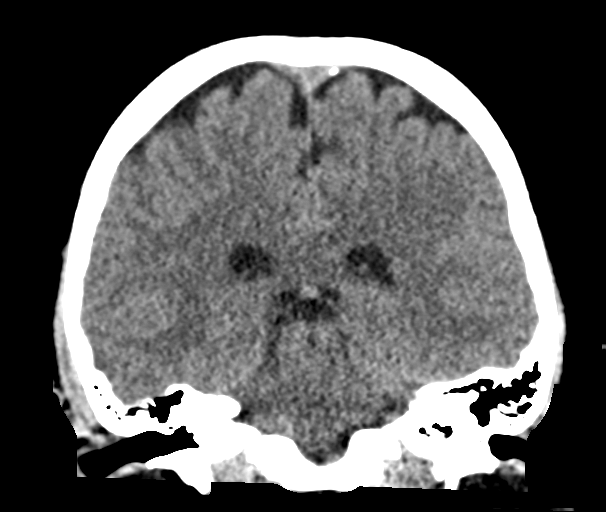
[im 39/70  brain]
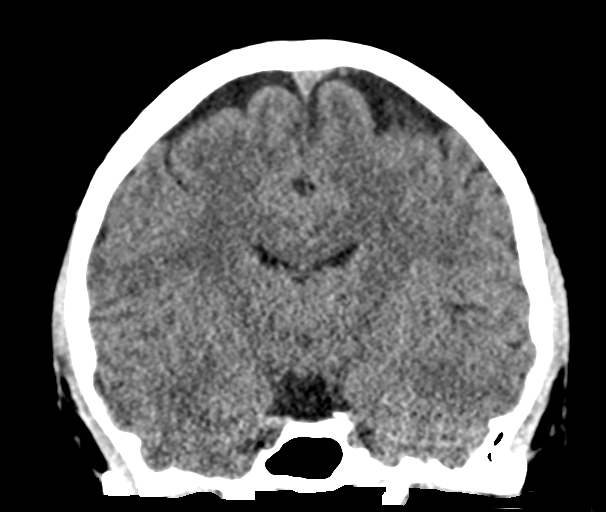

[Series 5: sag soft · sagittal · 0.29mm/px · 3 of 56 slices shown]
[im 19/56  brain]
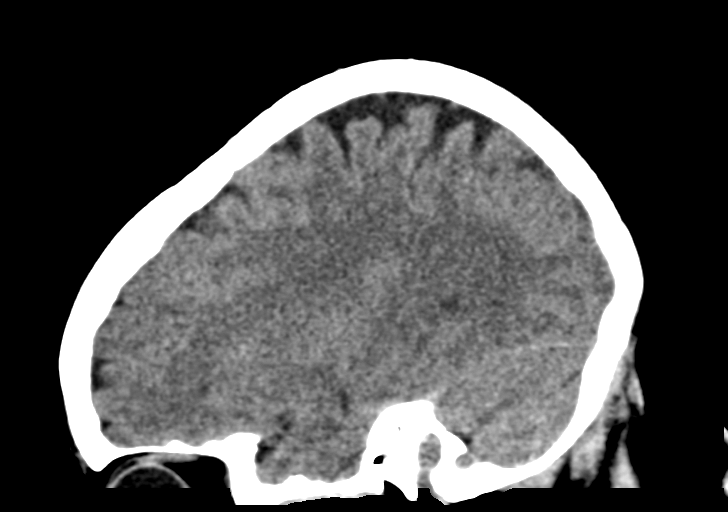
[im 28/56  brain]
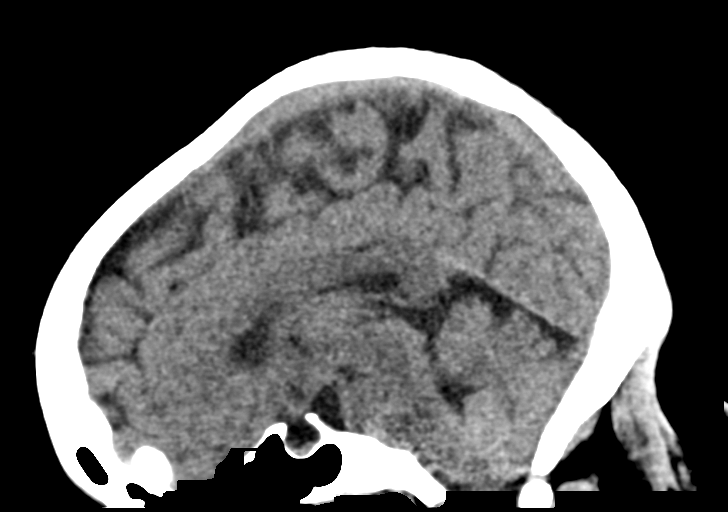
[im 37/56  brain]
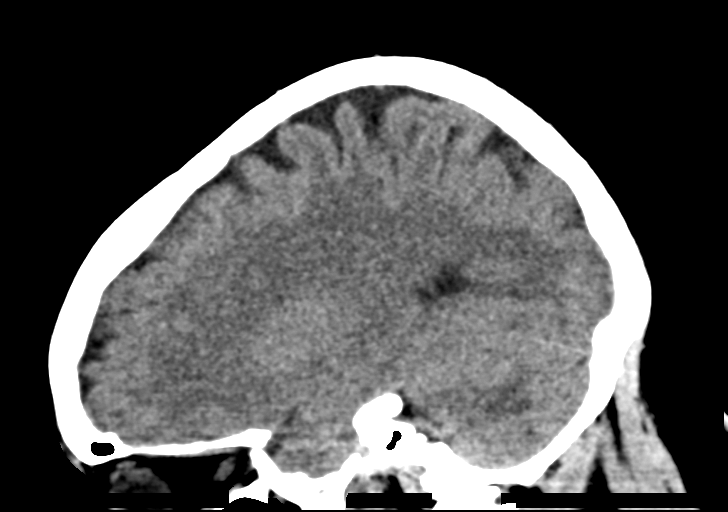

[15 of 47 positions shown; findings below may reference images not displayed]

FINDINGS: Brain: No evidence of acute infarction, hemorrhage, hydrocephalus,
extra-axial collection or mass lesion/mass effect. Incidental
partially empty sella turcica.

Vascular: No hyperdense vessel or unexpected calcification.

Skull: Normal. Negative for fracture or focal lesion.

Sinuses/Orbits: No acute finding.

Other: None.
IMPRESSION: No acute intracranial abnormality identified. Unremarkable CT of
head.

By: Rene Rolando Yoongi M.D.
